# Patient Record
Sex: Male | Born: 1995 | Race: White | Hispanic: No | Marital: Single | Smoking: Former smoker
Health system: Southern US, Community
[De-identification: ages and names within clinical notes are randomized; demographics above are authoritative.]

---
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
## 2014-05-07 HISTORY — PX: ORIF ANKLE FRACTURE BIMALLEOLAR: SUR920

## 2015-02-11 ENCOUNTER — Ambulatory Visit (INDEPENDENT_AMBULATORY_CARE_PROVIDER_SITE_OTHER): Payer: BLUE CROSS/BLUE SHIELD | Admitting: Family Medicine

## 2015-02-11 ENCOUNTER — Encounter: Payer: Self-pay | Admitting: Family Medicine

## 2015-02-11 VITALS — BP 142/98 | HR 97 | Temp 98.8°F | Ht 72.08 in | Wt 270.0 lb

## 2015-02-11 DIAGNOSIS — Z130 Encounter for screening for diseases of the blood and blood-forming organs and certain disorders involving the immune mechanism: Secondary | ICD-10-CM

## 2015-02-11 DIAGNOSIS — K648 Other hemorrhoids: Secondary | ICD-10-CM | POA: Diagnosis not present

## 2015-02-11 DIAGNOSIS — K644 Residual hemorrhoidal skin tags: Secondary | ICD-10-CM

## 2015-02-11 DIAGNOSIS — E669 Obesity, unspecified: Secondary | ICD-10-CM

## 2015-02-11 DIAGNOSIS — Z0000 Encounter for general adult medical examination without abnormal findings: Secondary | ICD-10-CM | POA: Diagnosis not present

## 2015-02-11 MED ORDER — HYDROCORTISONE 2.5 % RE CREA: 1.0000 "application " | TOPICAL_CREAM | Freq: Two times a day (BID) | RECTAL | Status: AC

## 2015-02-11 NOTE — Patient Instructions (Signed)
Miralax daily (if needed). BM's should be soft and easy to pass.  You can also use an OTC stool softener if needed (Colace).  Use the topical agent to help with the irritation/pain.  You can also use Tucks pads.  Follow up annually or sooner if needed.  Take care  Dr. Adriana Simasook

## 2015-02-11 NOTE — Progress Notes (Signed)
Pre visit review using our clinic review tool, if applicable. No additional management support is needed unless otherwise documented below in the visit note. 

## 2015-02-12 DIAGNOSIS — Z0000 Encounter for general adult medical examination without abnormal findings: Secondary | ICD-10-CM | POA: Insufficient documentation

## 2015-02-12 DIAGNOSIS — E669 Obesity, unspecified: Secondary | ICD-10-CM | POA: Insufficient documentation

## 2015-02-12 DIAGNOSIS — K644 Residual hemorrhoidal skin tags: Secondary | ICD-10-CM | POA: Insufficient documentation

## 2015-02-12 NOTE — Assessment & Plan Note (Signed)
New problem. Treating with Anusol. Advised Miralax and/or Colace to soften stool and prevent straining.

## 2015-02-12 NOTE — Assessment & Plan Note (Signed)
Declined Flu. Tetanus up to date. After visit, patient's mother insisted on screening labs. Patient can return for these.

## 2015-02-12 NOTE — Progress Notes (Signed)
Subjective:  Patient ID: Frank Krause, male    DOB: 1995-06-04  Age: 20 y.o. MRN: [id number]  CC: Establish care; Rectal pain/Hemorrhoid  HPI Frank Krause is a 20 y.o. male presents to the clinic today as a new patient with the above complaint.  Preventative Healthcare  Immunizations  Tetanus - Up to date.  Flu - Declines.   Exercise: Does not exercise regularly.   Alcohol use: See below.  Smoking/tobacco use: See below.  Rectal pain/Hemorrhoid  Pain occurs with BM's.  Has been going on for the past few weeks.  He reports blood with wiping.  BM's are intermittently hard and require straining to pass.  No change in diet but eats poorly Chiropractor diet).  No associated Abd pain.  No relieving factors.  Worsened with straining.  No recent weight loss or other concerning symptoms.  PMH, Surgical Hx, Family Hx, Social History reviewed and updated as below.  History reviewed. No pertinent past medical history.   Past Surgical History  Procedure Laterality Date  . Orif ankle fracture bimalleolar Right 05/2014   Family History  Problem Relation Age of Onset  . Hyperlipidemia Mother   . Hypertension Mother   . Diabetes Mother   . Hyperlipidemia Father   . Stroke Maternal Grandmother   . Diabetes Maternal Grandmother   . Prostate cancer Maternal Grandfather   . Kidney disease Paternal Grandmother   . Lung cancer Paternal Grandfather    Social History  Substance Use Topics  . Smoking status: Former Research scientist (life sciences)  . Smokeless tobacco: Never Used  . Alcohol Use: 3.6 oz/week    6 Standard drinks or equivalent per week   Review of Systems  Gastrointestinal: Positive for rectal pain.  All other systems reviewed and are negative.  Objective:   Today's Vitals: BP 142/98 mmHg  Pulse 97  Temp(Src) 98.8 F (37.1 C) (Oral)  Ht 6' 0.08" (1.831 m)  Wt 270 lb (122.471 kg)  BMI 36.53 kg/m2  SpO2 97%  Physical Exam  Constitutional: He is oriented to  person, place, and time. He appears well-developed and well-nourished. No distress.  HENT:  Head: Normocephalic and atraumatic.  Nose: Nose normal.  Mouth/Throat: Oropharynx is clear and moist. No oropharyngeal exudate.  Normal TM's bilaterally.   Eyes: Conjunctivae are normal. No scleral icterus.  Neck: Neck supple.  Cardiovascular: Normal rate and regular rhythm.   No murmur heard. Pulmonary/Chest: Effort normal and breath sounds normal. He has no wheezes. He has no rales.  Abdominal: Soft. He exhibits no distension. There is no tenderness. There is no rebound and no guarding.  Genitourinary:  Small external hemorrhoid noted on exam.  Musculoskeletal: Normal range of motion. He exhibits no edema.  Neurological: He is alert and oriented to person, place, and time.  Skin: Skin is warm and dry. No rash noted.  Psychiatric: He has a normal mood and affect.  Vitals reviewed.  Assessment & Plan:   Problem List Items Addressed This Visit    Preventative health care    Declined Flu. Tetanus up to date. After visit, patient's mother insisted on screening labs. Patient can return for these.      Obesity (BMI 35.0-39.9 without comorbidity) (HCC)   Relevant Orders   Comp Met (CMET)   Lipid panel   HgB A1c   External hemorrhoid - Primary    New problem. Treating with Anusol. Advised Miralax and/or Colace to soften stool and prevent straining.        Other  Visit Diagnoses    Screening for deficiency anemia        Relevant Orders    CBC       Outpatient Encounter Prescriptions as of 02/11/2015  Medication Sig  . hydrocortisone (ANUSOL-HC) 2.5 % rectal cream Place 1 application rectally 2 (two) times daily.  . [DISCONTINUED] acetaminophen-codeine (TYLENOL #3) 300-30 MG per tablet Take 1-2 tablets by mouth every 6 (six) hours as needed. For pain  . [DISCONTINUED] predniSONE (STERAPRED UNI-PAK) 10 MG tablet Take 40 mg by mouth daily. He is on day 4 of pack   No  facility-administered encounter medications on file as of 02/11/2015.    Follow-up: Annually or sooner if needed.  [place]

## 2015-03-04 ENCOUNTER — Other Ambulatory Visit: Payer: BLUE CROSS/BLUE SHIELD

## 2015-03-11 ENCOUNTER — Other Ambulatory Visit: Payer: BLUE CROSS/BLUE SHIELD

## 2015-03-25 ENCOUNTER — Other Ambulatory Visit: Payer: BLUE CROSS/BLUE SHIELD

## 2018-08-18 ENCOUNTER — Other Ambulatory Visit: Payer: Self-pay | Admitting: Student

## 2018-08-18 DIAGNOSIS — R140 Abdominal distension (gaseous): Secondary | ICD-10-CM

## 2018-08-18 DIAGNOSIS — R194 Change in bowel habit: Secondary | ICD-10-CM

## 2018-08-18 DIAGNOSIS — R109 Unspecified abdominal pain: Secondary | ICD-10-CM

## 2018-09-08 ENCOUNTER — Inpatient Hospital Stay: Payer: BC Managed Care – PPO | Admitting: Anesthesiology

## 2018-09-08 ENCOUNTER — Ambulatory Visit: Payer: Self-pay | Admitting: General Surgery

## 2018-09-08 ENCOUNTER — Other Ambulatory Visit
Admission: RE | Admit: 2018-09-08 | Discharge: 2018-09-08 | Disposition: A | Discharge status: Home / Self Care | Payer: BC Managed Care – PPO | Source: Ambulatory Visit | Attending: General Surgery | Admitting: General Surgery

## 2018-09-08 ENCOUNTER — Other Ambulatory Visit: Payer: Self-pay

## 2018-09-08 ENCOUNTER — Ambulatory Visit
Admission: RE | Admit: 2018-09-08 | Discharge: 2018-09-08 | Disposition: A | Discharge status: Home / Self Care | Payer: BC Managed Care – PPO | Source: Ambulatory Visit | Attending: Student | Admitting: Student

## 2018-09-08 ENCOUNTER — Observation Stay
Admission: AD | Admit: 2018-09-08 | Discharge: 2018-09-09 | Disposition: A | Discharge status: Home / Self Care | Payer: BC Managed Care – PPO | Source: Ambulatory Visit | Attending: General Surgery | Admitting: General Surgery

## 2018-09-08 ENCOUNTER — Encounter
Admission: AD | Disposition: A | Discharge status: Home / Self Care | Payer: Self-pay | Source: Ambulatory Visit | Attending: General Surgery

## 2018-09-08 DIAGNOSIS — K3580 Unspecified acute appendicitis: Principal | ICD-10-CM | POA: Insufficient documentation

## 2018-09-08 DIAGNOSIS — K353 Acute appendicitis with localized peritonitis, without perforation or gangrene: Secondary | ICD-10-CM | POA: Diagnosis present

## 2018-09-08 DIAGNOSIS — Z20828 Contact with and (suspected) exposure to other viral communicable diseases: Secondary | ICD-10-CM | POA: Insufficient documentation

## 2018-09-08 DIAGNOSIS — K381 Appendicular concretions: Secondary | ICD-10-CM | POA: Insufficient documentation

## 2018-09-08 DIAGNOSIS — R109 Unspecified abdominal pain: Secondary | ICD-10-CM

## 2018-09-08 DIAGNOSIS — R140 Abdominal distension (gaseous): Secondary | ICD-10-CM

## 2018-09-08 DIAGNOSIS — R194 Change in bowel habit: Secondary | ICD-10-CM | POA: Insufficient documentation

## 2018-09-08 DIAGNOSIS — Z87891 Personal history of nicotine dependence: Secondary | ICD-10-CM | POA: Insufficient documentation

## 2018-09-08 HISTORY — PX: LAPAROSCOPIC APPENDECTOMY: SHX408

## 2018-09-08 LAB — SARS CORONAVIRUS 2 BY RT PCR (HOSPITAL ORDER, PERFORMED IN ~~LOC~~ HOSPITAL LAB): SARS Coronavirus 2: NEGATIVE

## 2018-09-08 LAB — CREATININE, SERUM
Creatinine, Ser: 0.83 mg/dL (ref 0.61–1.24)
GFR calc Af Amer: >60 mL/min (ref >60)
GFR calc non Af Amer: >60 mL/min (ref >60)

## 2018-09-08 LAB — MRSA PCR SCREENING: MRSA by PCR: NEGATIVE

## 2018-09-08 SURGERY — APPENDECTOMY, LAPAROSCOPIC
Anesthesia: General

## 2018-09-08 MED ORDER — KETOROLAC TROMETHAMINE 30 MG/ML IJ SOLN
INTRAMUSCULAR | Status: AC
  Filled 2018-09-08: qty 1

## 2018-09-08 MED ORDER — FENTANYL CITRATE (PF) 100 MCG/2ML IJ SOLN
INTRAMUSCULAR | Status: DC | PRN
  Administered 2018-09-08 (×4): 50 ug via INTRAVENOUS

## 2018-09-08 MED ORDER — SUGAMMADEX SODIUM 200 MG/2ML IV SOLN
INTRAVENOUS | Status: AC
  Filled 2018-09-08: qty 2

## 2018-09-08 MED ORDER — FENTANYL CITRATE (PF) 100 MCG/2ML IJ SOLN
INTRAMUSCULAR | Status: AC
  Filled 2018-09-08: qty 2

## 2018-09-08 MED ORDER — PROPOFOL 10 MG/ML IV BOLUS
INTRAVENOUS | Status: AC
  Filled 2018-09-08: qty 20

## 2018-09-08 MED ORDER — DEXAMETHASONE SODIUM PHOSPHATE 10 MG/ML IJ SOLN
INTRAMUSCULAR | Status: DC | PRN
  Administered 2018-09-08: 10 mg via INTRAVENOUS

## 2018-09-08 MED ORDER — SODIUM CHLORIDE 0.9 % IV SOLN
INTRAVENOUS | Status: DC
  Administered 2018-09-08: 17:00:00 via INTRAVENOUS

## 2018-09-08 MED ORDER — FENTANYL CITRATE (PF) 100 MCG/2ML IJ SOLN
25.0000 ug | INTRAMUSCULAR | Status: DC | PRN
  Administered 2018-09-08 (×4): 25 ug via INTRAVENOUS

## 2018-09-08 MED ORDER — ENOXAPARIN SODIUM 40 MG/0.4ML ~~LOC~~ SOLN: 40.0000 mg | SUBCUTANEOUS | Status: DC

## 2018-09-08 MED ORDER — DEXAMETHASONE SODIUM PHOSPHATE 10 MG/ML IJ SOLN
INTRAMUSCULAR | Status: AC
  Filled 2018-09-08: qty 1

## 2018-09-08 MED ORDER — BUPIVACAINE-EPINEPHRINE 0.5% -1:200000 IJ SOLN
INTRAMUSCULAR | Status: DC | PRN
  Administered 2018-09-08: 28 mL

## 2018-09-08 MED ORDER — MIDAZOLAM HCL 2 MG/2ML IJ SOLN
INTRAMUSCULAR | Status: AC
  Filled 2018-09-08: qty 2

## 2018-09-08 MED ORDER — ACETAMINOPHEN 325 MG PO TABS: 650.0000 mg | ORAL_TABLET | Freq: Four times a day (QID) | ORAL | Status: DC | PRN

## 2018-09-08 MED ORDER — ONDANSETRON HCL 4 MG/2ML IJ SOLN: 4.0000 mg | Freq: Four times a day (QID) | INTRAMUSCULAR | Status: DC | PRN

## 2018-09-08 MED ORDER — MORPHINE SULFATE (PF) 4 MG/ML IV SOLN
4.0000 mg | INTRAVENOUS | Status: DC | PRN
  Administered 2018-09-09: 4 mg via INTRAVENOUS
  Filled 2018-09-08: qty 1

## 2018-09-08 MED ORDER — IOHEXOL 300 MG/ML  SOLN
100.0000 mL | Freq: Once | INTRAMUSCULAR | Status: AC | PRN
  Administered 2018-09-08: 11:00:00 100 mL via INTRAVENOUS

## 2018-09-08 MED ORDER — ACETAMINOPHEN 650 MG RE SUPP: 650.0000 mg | Freq: Four times a day (QID) | RECTAL | Status: DC | PRN

## 2018-09-08 MED ORDER — KETOROLAC TROMETHAMINE 30 MG/ML IJ SOLN
INTRAMUSCULAR | Status: DC | PRN
  Administered 2018-09-08: 30 mg via INTRAVENOUS

## 2018-09-08 MED ORDER — LACTATED RINGERS IV SOLN
INTRAVENOUS | Status: DC | PRN
  Administered 2018-09-08 (×2): via INTRAVENOUS

## 2018-09-08 MED ORDER — PIPERACILLIN-TAZOBACTAM 3.375 G IVPB
3.3750 g | Freq: Three times a day (TID) | INTRAVENOUS | Status: DC
  Administered 2018-09-08 – 2018-09-09 (×3): 3.375 g via INTRAVENOUS
  Filled 2018-09-08 (×3): qty 50

## 2018-09-08 MED ORDER — ROCURONIUM BROMIDE 100 MG/10ML IV SOLN
INTRAVENOUS | Status: DC | PRN
  Administered 2018-09-08: 10 mg via INTRAVENOUS
  Administered 2018-09-08: 20 mg via INTRAVENOUS
  Administered 2018-09-08: 40 mg via INTRAVENOUS

## 2018-09-08 MED ORDER — ONDANSETRON 4 MG PO TBDP: 4.0000 mg | ORAL_TABLET | Freq: Four times a day (QID) | ORAL | Status: DC | PRN

## 2018-09-08 MED ORDER — ONDANSETRON HCL 4 MG/2ML IJ SOLN: 4.0000 mg | Freq: Once | INTRAMUSCULAR | Status: DC | PRN

## 2018-09-08 MED ORDER — BUPIVACAINE-EPINEPHRINE (PF) 0.5% -1:200000 IJ SOLN
INTRAMUSCULAR | Status: AC
  Filled 2018-09-08: qty 30

## 2018-09-08 MED ORDER — PROPOFOL 10 MG/ML IV BOLUS
INTRAVENOUS | Status: DC | PRN
  Administered 2018-09-08: 200 mg via INTRAVENOUS

## 2018-09-08 MED ORDER — DEXMEDETOMIDINE HCL 200 MCG/2ML IV SOLN
INTRAVENOUS | Status: DC | PRN
  Administered 2018-09-08 (×6): 2 ug via INTRAVENOUS

## 2018-09-08 MED ORDER — ONDANSETRON HCL 4 MG/2ML IJ SOLN
INTRAMUSCULAR | Status: DC | PRN
  Administered 2018-09-08: 4 mg via INTRAVENOUS

## 2018-09-08 MED ORDER — HYDROCODONE-ACETAMINOPHEN 5-325 MG PO TABS
1.0000 | ORAL_TABLET | ORAL | Status: DC | PRN
  Administered 2018-09-09: 2 via ORAL
  Filled 2018-09-08: qty 2

## 2018-09-08 MED ORDER — FENTANYL CITRATE (PF) 100 MCG/2ML IJ SOLN
INTRAMUSCULAR | Status: AC
  Administered 2018-09-08: 25 ug via INTRAVENOUS
  Filled 2018-09-08: qty 2

## 2018-09-08 MED ORDER — ONDANSETRON HCL 4 MG/2ML IJ SOLN
INTRAMUSCULAR | Status: AC
  Filled 2018-09-08: qty 2

## 2018-09-08 MED ORDER — MIDAZOLAM HCL 2 MG/2ML IJ SOLN
INTRAMUSCULAR | Status: DC | PRN
  Administered 2018-09-08: 2 mg via INTRAVENOUS

## 2018-09-08 SURGICAL SUPPLY — 42 items
APPLIER CLIP LOGIC TI 5 (MISCELLANEOUS) IMPLANT
BLADE SURG SZ11 CARB STEEL (BLADE) ×3 IMPLANT
CANISTER SUCT 1200ML W/VALVE (MISCELLANEOUS) ×3 IMPLANT
CHLORAPREP W/TINT 26 (MISCELLANEOUS) ×3 IMPLANT
COVER WAND RF STERILE (DRAPES) ×1 IMPLANT
CUTTER FLEX LINEAR 45M (STAPLE) ×3 IMPLANT
DERMABOND ADVANCED (GAUZE/BANDAGES/DRESSINGS) ×2
DERMABOND ADVANCED .7 DNX12 (GAUZE/BANDAGES/DRESSINGS) ×1 IMPLANT
ELECT REM PT RETURN 9FT ADLT (ELECTROSURGICAL) ×3
ELECTRODE REM PT RTRN 9FT ADLT (ELECTROSURGICAL) ×1 IMPLANT
GLOVE BIO SURGEON STRL SZ 6.5 (GLOVE) ×4 IMPLANT
GLOVE BIO SURGEONS STRL SZ 6.5 (GLOVE) ×3
GLOVE INDICATOR 6.5 STRL GRN (GLOVE) ×7 IMPLANT
GOWN STRL REUS W/ TWL LRG LVL3 (GOWN DISPOSABLE) ×3 IMPLANT
GOWN STRL REUS W/TWL LRG LVL3 (GOWN DISPOSABLE) ×6
GRASPER SUT TROCAR 14GX15 (MISCELLANEOUS) ×3 IMPLANT
HANDLE YANKAUER SUCT BULB TIP (MISCELLANEOUS) ×3 IMPLANT
IRRIGATION STRYKERFLOW (MISCELLANEOUS) IMPLANT
IRRIGATOR STRYKERFLOW (MISCELLANEOUS) ×3
IV NS 1000ML (IV SOLUTION) ×2
IV NS 1000ML BAXH (IV SOLUTION) ×1 IMPLANT
KIT TURNOVER KIT A (KITS) ×3 IMPLANT
LIGASURE LAP MARYLAND 5MM 37CM (ELECTROSURGICAL) ×3 IMPLANT
NEEDLE HYPO 22GX1.5 SAFETY (NEEDLE) ×3 IMPLANT
NEEDLE VERESS 14GA 120MM (NEEDLE) ×3 IMPLANT
NS IRRIG 500ML POUR BTL (IV SOLUTION) ×3 IMPLANT
PACK LAP CHOLECYSTECTOMY (MISCELLANEOUS) ×3 IMPLANT
POUCH ENDO CATCH 10MM SPEC (MISCELLANEOUS) ×3 IMPLANT
RELOAD 45 VASCULAR/THIN (ENDOMECHANICALS) IMPLANT
RELOAD STAPLE 45 2.5 WHT GRN (ENDOMECHANICALS) IMPLANT
RELOAD STAPLE 45 3.5 BLU ETS (ENDOMECHANICALS) ×1 IMPLANT
RELOAD STAPLE TA45 3.5 REG BLU (ENDOMECHANICALS) ×3 IMPLANT
SCISSORS METZENBAUM CVD 33 (INSTRUMENTS) ×1 IMPLANT
SET TUBE SMOKE EVAC HIGH FLOW (TUBING) ×3 IMPLANT
SLEEVE ENDOPATH XCEL 5M (ENDOMECHANICALS) ×3 IMPLANT
SPONGE GAUZE 2X2 8PLY STER LF (GAUZE/BANDAGES/DRESSINGS)
SPONGE GAUZE 2X2 8PLY STRL LF (GAUZE/BANDAGES/DRESSINGS) ×1 IMPLANT
SUT MNCRL AB 4-0 PS2 18 (SUTURE) ×3 IMPLANT
SUT VICRYL PLUS ABS 0 54 (SUTURE) ×3 IMPLANT
TRAY FOLEY MTR SLVR 16FR STAT (SET/KITS/TRAYS/PACK) ×3 IMPLANT
TROCAR XCEL 12X100 BLDLESS (ENDOMECHANICALS) ×3 IMPLANT
TROCAR XCEL NON-BLD 5MMX100MML (ENDOMECHANICALS) ×3 IMPLANT

## 2018-09-08 NOTE — Anesthesia Post-op Follow-up Note (Signed)
Anesthesia QCDR form completed.        

## 2018-09-08 NOTE — Anesthesia Preprocedure Evaluation (Signed)
Anesthesia Evaluation  Patient identified by MRN, date of birth, ID band Patient awake    Reviewed: Allergy & Precautions, NPO status , Patient's Chart, lab work & pertinent test results  History of Anesthesia Complications Negative for: history of anesthetic complications  Airway Mallampati: II       Dental   Pulmonary neg sleep apnea, neg COPD, former smoker,           Cardiovascular (-) hypertension(-) Past MI and (-) CHF (-) dysrhythmias (-) Valvular Problems/Murmurs     Neuro/Psych neg Seizures    GI/Hepatic Neg liver ROS, neg GERD  ,  Endo/Other  neg diabetes  Renal/GU negative Renal ROS     Musculoskeletal   Abdominal   Peds  Hematology   Anesthesia Other Findings   Reproductive/Obstetrics                             Anesthesia Physical Anesthesia Plan  ASA: I and emergent  Anesthesia Plan: General   Post-op Pain Management:    Induction: Intravenous  PONV Risk Score and Plan: 2 and Dexamethasone and Ondansetron  Airway Management Planned: Oral ETT  Additional Equipment:   Intra-op Plan:   Post-operative Plan:   Informed Consent: I have reviewed the patients History and Physical, chart, labs and discussed the procedure including the risks, benefits and alternatives for the proposed anesthesia with the patient or authorized representative who has indicated his/her understanding and acceptance.       Plan Discussed with:   Anesthesia Plan Comments:         Anesthesia Quick Evaluation

## 2018-09-08 NOTE — Anesthesia Postprocedure Evaluation (Signed)
Anesthesia Post Note  Patient: Frank Krause  Procedure(s) Performed: APPENDECTOMY LAPAROSCOPIC (N/A )  Patient location during evaluation: PACU Anesthesia Type: General Level of consciousness: awake and alert Pain management: pain level controlled Vital Signs Assessment: post-procedure vital signs reviewed and stable Respiratory status: spontaneous breathing and respiratory function stable Cardiovascular status: stable Anesthetic complications: no     Last Vitals:  Vitals:   09/08/18 1641 09/08/18 2214  BP: (!) 164/82 126/64  Pulse: (!) 56 85  Resp: 16 20  Temp: 36.8 C 36.6 C  SpO2: 100% 100%    Last Pain:  Vitals:   09/08/18 2214  TempSrc:   PainSc: Asleep                 Aariya Ferrick K

## 2018-09-08 NOTE — Anesthesia Procedure Notes (Signed)
Procedure Name: Intubation Date/Time: 09/08/2018 8:33 PM Performed by: Lendon Colonel, CRNA Pre-anesthesia Checklist: Patient identified, Patient being monitored, Timeout performed, Emergency Drugs available and Suction available Patient Re-evaluated:Patient Re-evaluated prior to induction Oxygen Delivery Method: Circle system utilized Preoxygenation: Pre-oxygenation with 100% oxygen Induction Type: IV induction Ventilation: Mask ventilation without difficulty Laryngoscope Size: Miller and 2 Grade View: Grade I Tube type: Oral Tube size: 7.5 mm Number of attempts: 1 Airway Equipment and Method: Stylet Placement Confirmation: ETT inserted through vocal cords under direct vision,  positive ETCO2 and breath sounds checked- equal and bilateral Secured at: 21 cm Tube secured with: Tape Dental Injury: Teeth and Oropharynx as per pre-operative assessment

## 2018-09-08 NOTE — H&P (Signed)
SURGICAL CONSULTATION NOTE   HISTORY OF PRESENT ILLNESS (HPI):  23 y.o. male presented to [facility] ED for evaluation of abdominal pain. Patient reports started with abdominal pain since 2 days ago. Pain on right lower quadrant. Pain does not radiates to other part of the body. There has been no alleviating or aggravating factors. Denies vomiting. Denies fever or chills.  Surgery is consulted by Michelle Johnson, PA in this context for evaluation and management of acute appendicitis.  PAST MEDICAL HISTORY (PMH):  No pertinent past medical history. History reviewed.   PAST SURGICAL HISTORY (PSH):  Past Surgical History:  Procedure Laterality Date  . ORIF ANKLE FRACTURE BIMALLEOLAR Right 05/2014     MEDICATIONS:  Prior to Admission medications   Medication Sig Start Date End Date Taking? Authorizing Provider  hydrocortisone (ANUSOL-HC) 2.5 % rectal cream Place 1 application rectally 2 (two) times daily. 02/11/15   Cook, Jayce G, DO     ALLERGIES:  No Known Allergies   SOCIAL HISTORY:  Social History   Socioeconomic History  . Marital status: Single    Spouse name: Not on file  . Number of children: Not on file  . Years of education: Not on file  . Highest education level: Not on file  Occupational History  . Not on file  Social Needs  . Financial resource strain: Not on file  . Food insecurity    Worry: Not on file    Inability: Not on file  . Transportation needs    Medical: Not on file    Non-medical: Not on file  Tobacco Use  . Smoking status: Former Smoker  . Smokeless tobacco: Never Used  Substance and Sexual Activity  . Alcohol use: Yes    Alcohol/week: 6.0 standard drinks    Types: 6 Standard drinks or equivalent per week  . Drug use: No  . Sexual activity: Yes    Partners: Female  Lifestyle  . Physical activity    Days per week: Not on file    Minutes per session: Not on file  . Stress: Not on file  Relationships  . Social connections    Talks on phone:  Not on file    Gets together: Not on file    Attends religious service: Not on file    Active member of club or organization: Not on file    Attends meetings of clubs or organizations: Not on file    Relationship status: Not on file  . Intimate partner violence    Fear of current or ex partner: Not on file    Emotionally abused: Not on file    Physically abused: Not on file    Forced sexual activity: Not on file  Other Topics Concern  . Not on file  Social History Narrative  . Not on file    The patient currently resides (home / rehab facility / nursing home): Home The patient normally is (ambulatory / bedbound): Ambulatory   FAMILY HISTORY:  Family History  Problem Relation Age of Onset  . Hyperlipidemia Mother   . Hypertension Mother   . Diabetes Mother   . Hyperlipidemia Father   . Stroke Maternal Grandmother   . Diabetes Maternal Grandmother   . Prostate cancer Maternal Grandfather   . Kidney disease Paternal Grandmother   . Lung cancer Paternal Grandfather      REVIEW OF SYSTEMS:  Constitutional: denies weight loss, fever, chills, or sweats  Eyes: denies any other vision changes, history of eye injury    ENT: denies sore throat, hearing problems  Respiratory: denies shortness of breath, wheezing  Cardiovascular: denies chest pain, palpitations  Gastrointestinal: positive abdominal pain, denies nausea and vomiting Genitourinary: denies burning with urination or urinary frequency Musculoskeletal: denies any other joint pains or cramps  Skin: denies any other rashes or skin discolorations  Neurological: denies any other headache, dizziness, weakness  Psychiatric: denies any other depression, anxiety   All other review of systems were negative   VITAL SIGNS:  BP: 145/89 Pulse: 73 Temp: 36.7 C  Weight: 98.2 kg BMI: 29.35   PHYSICAL EXAM:  Constitutional:  -- Normal body habitus  -- Awake, alert, and oriented x3  Eyes:  -- Pupils equally round and reactive  to light  -- No scleral icterus  Ear, nose, and throat:  -- No jugular venous distension  Pulmonary:  -- No crackles  -- Equal breath sounds bilaterally -- Breathing non-labored at rest Cardiovascular:  -- S1, S2 present  -- No pericardial rubs Gastrointestinal:  -- Abdomen soft, tender in right lower quadrant, non-distended, no guarding or rebound tenderness -- No abdominal masses appreciated, pulsatile or otherwise  Musculoskeletal and Integumentary:  -- Wounds or skin discoloration: None appreciated -- Extremities: B/L UE and LE FROM, hands and feet warm, no edema  Neurologic:  -- Motor function: intact and symmetric -- Sensation: intact and symmetric   Labs:  No flowsheet data found. CMP Latest Ref Rng & Units 04/24/2011  Glucose 70 - 99 mg/dL 124(H)  BUN 6 - 23 mg/dL 9  Creatinine 0.47 - 1.00 mg/dL 0.82  Sodium 135 - 145 mEq/L 134(L)  Potassium 3.5 - 5.1 mEq/L 4.0  Chloride 96 - 112 mEq/L 97  CO2 19 - 32 mEq/L 23  Calcium 8.4 - 10.5 mg/dL 9.4   CBC from 08/04/2018 shows WBC of 8.2, Hemoglobin of 14.9 and Platelets of 375.   Imaging studies: I personally evaluated the images.   EXAM: CT ABDOMEN AND PELVIS WITH CONTRAST  TECHNIQUE: Multidetector CT imaging of the abdomen and pelvis was performed using the standard protocol following bolus administration of intravenous contrast. Oral contrast was also administered w.  CONTRAST:  100mL OMNIPAQUE IOHEXOL 300 MG/ML  SOLN  COMPARISON:  None.  FINDINGS: Lower chest: Lung bases are clear.  Hepatobiliary: There is fatty infiltration near the fissure for the ligamentum teres. No focal liver lesions are evident. Gallbladder wall is not appreciably thickened. There is no biliary duct dilatation.  Pancreas: There is no pancreatic mass or inflammatory focus.  Spleen: Spleen measures 14.1 x 11.7 x 6.4 cm with a measured splenic volume of 528 cubic cm. No focal splenic lesions are evident.  Adrenals/Urinary  Tract: Adrenals bilaterally appear normal. Kidneys bilaterally show no evident mass or hydronephrosis on either side. There is no renal or ureteral calculus on either side. Urinary bladder is midline with wall thickness within normal limits.  Stomach/Bowel: There is no evident bowel wall or mesenteric thickening. No bowel obstruction. Terminal ileum appears unremarkable. There is no free air or portal venous air.  Vascular/Lymphatic: No abdominal aortic aneurysm. No vascular lesions are evident. There is no adenopathy in the abdomen or pelvis.  Reproductive: The prostate and seminal vesicles are normal in size and contour. No evident pelvic mass.  Other: The appendix is diffusely thickened, measuring up to 23 mm in thickness. There is an appendicolith in the proximal appendix measuring 9 x 6 mm. There is soft tissue stranding along the course of the appendix. No abscess or perforation evident in   this area.  No abscess or ascites evident in the abdomen or pelvis.  Musculoskeletal: There are no blastic or lytic bone lesions. There is no intramuscular or abdominal wall lesions.  IMPRESSION: 1.  Findings indicative of acute appendicitis.  Appendix: Location: Appendix arises medially from the cecum in the upper pelvic region on the right.  Diameter: Up to 23 mm proximally.  Appendix tapers more distally.  Appendicolith: 9 x 6 mm appendicolith.  Mucosal hyper-enhancement: None  Extraluminal gas: None  Periappendiceal collection: Slight. There is stranding in the soft tissues adjacent to the appendix. No appendiceal abscess evident.  2.  Splenomegaly of uncertain etiology.  No splenic lesions evident.  3.  No bowel obstruction.  No abscess in the abdomen or pelvis.  4. No renal or ureteral calculus. No hydronephrosis. Urinary bladder wall thickness normal.  Critical Value/emergent results were called by telephone at the time of interpretation on 09/08/2018  at 11:38 am to AMBER BROWN, RN who verbally acknowledged these results. Ms. Brown promised to make Michelle Johnson, PA, aware of this report as soon as she was available.   Electronically Signed   By: William  Woodruff III M.D.   On: 09/08/2018 11:39   Assessment/Plan:  23 y.o. male with acute appendicitis.  Patient with history, physical exam and images consistent with acute appendicitis. Patient oriented about diagnosis and surgical management as treatment. Patient oriented about goals of surgery and its risk including: bowel injury, infection, abscess, bleeding, leak from cecum, intestinal adhesions, bowel obstruction, fistula, injury to the ureter among others.  Patient understood and agreed to proceed with surgery. Will admit patient, already started on antibiotic therapy, will give IV hydration since patient is NPO and schedule to OR.   Yadir Zentner Cintrn-Daz, MD  

## 2018-09-08 NOTE — H&P (Signed)
SURGICAL CONSULTATION NOTE   HISTORY OF PRESENT ILLNESS (HPI):  23 y.o. male [facility] for evaluation of abdominal pain. Patient reports started with abdominal pain since 2 days ago. Pain on right lower quadrant. Pain does not radiates to other part of the body. There has been no alleviating or aggravating factors. Denies vomiting. Denies fever or chills.  Surgery is consulted by Harmon DunMichelle Johnson, PA in this context for evaluation and management of acute appendicitis.  PAST MEDICAL HISTORY (PMH):  No pertinent past medical history. History reviewed.   PAST SURGICAL HISTORY (PSH):  Past Surgical History:  Procedure Laterality Date  . ORIF ANKLE FRACTURE BIMALLEOLAR Right 05/2014     MEDICATIONS:  Prior to Admission medications   Medication Sig Start Date End Date Taking? Authorizing Provider  hydrocortisone (ANUSOL-HC) 2.5 % rectal cream Place 1 application rectally 2 (two) times daily. 02/11/15   Tommie Samsook, Jayce G, DO     ALLERGIES:  No Known Allergies   SOCIAL HISTORY:  Social History   Socioeconomic History  . Marital status: Single    Spouse name: Not on file  . Number of children: Not on file  . Years of education: Not on file  . Highest education level: Not on file  Occupational History  . Not on file  Social Needs  . Financial resource strain: Not on file  . Food insecurity    Worry: Not on file    Inability: Not on file  . Transportation needs    Medical: Not on file    Non-medical: Not on file  Tobacco Use  . Smoking status: Former Games developermoker  . Smokeless tobacco: Never Used  Substance and Sexual Activity  . Alcohol use: Yes    Alcohol/week: 6.0 standard drinks    Types: 6 Standard drinks or equivalent per week  . Drug use: No  . Sexual activity: Yes    Partners: Female  Lifestyle  . Physical activity    Days per week: Not on file    Minutes per session: Not on file  . Stress: Not on file  Relationships  . Social Musicianconnections    Talks on phone:  Not on file    Gets together: Not on file    Attends religious service: Not on file    Active member of club or organization: Not on file    Attends meetings of clubs or organizations: Not on file    Relationship status: Not on file  . Intimate partner violence    Fear of current or ex partner: Not on file    Emotionally abused: Not on file    Physically abused: Not on file    Forced sexual activity: Not on file  Other Topics Concern  . Not on file  Social History Narrative  . Not on file    The patient currently resides (home / rehab facility / nursing home): Home The patient normally is (ambulatory / bedbound): Ambulatory   FAMILY HISTORY:  Family History  Problem Relation Age of Onset  . Hyperlipidemia Mother   . Hypertension Mother   . Diabetes Mother   . Hyperlipidemia Father   . Stroke Maternal Grandmother   . Diabetes Maternal Grandmother   . Prostate cancer Maternal Grandfather   . Kidney disease Paternal Grandmother   . Lung cancer Paternal Grandfather      REVIEW OF SYSTEMS:  Constitutional: denies weight loss, fever, chills, or sweats  Eyes: denies any other vision changes, history of eye injury  ENT: denies sore throat, hearing problems  Respiratory: denies shortness of breath, wheezing  Cardiovascular: denies chest pain, palpitations  Gastrointestinal: positive abdominal pain, denies nausea and vomiting Genitourinary: denies burning with urination or urinary frequency Musculoskeletal: denies any other joint pains or cramps  Skin: denies any other rashes or skin discolorations  Neurological: denies any other headache, dizziness, weakness  Psychiatric: denies any other depression, anxiety   All other review of systems were negative   VITAL SIGNS:  BP: 145/89 Pulse: 73 Temp: 36.7 C  Weight: 98.2 kg BMI: 29.35   PHYSICAL EXAM:  Constitutional:  -- Normal body habitus  -- Awake, alert, and oriented x3  Eyes:  -- Pupils equally round and reactive  to light  -- No scleral icterus  Ear, nose, and throat:  -- No jugular venous distension  Pulmonary:  -- No crackles  -- Equal breath sounds bilaterally -- Breathing non-labored at rest Cardiovascular:  -- S1, S2 present  -- No pericardial rubs Gastrointestinal:  -- Abdomen soft, tender in right lower quadrant, non-distended, no guarding or rebound tenderness -- No abdominal masses appreciated, pulsatile or otherwise  Musculoskeletal and Integumentary:  -- Wounds or skin discoloration: None appreciated -- Extremities: B/L UE and LE FROM, hands and feet warm, no edema  Neurologic:  -- Motor function: intact and symmetric -- Sensation: intact and symmetric   Labs:  No flowsheet data found. CMP Latest Ref Rng & Units 04/24/2011  Glucose 70 - 99 mg/dL 124(H)  BUN 6 - 23 mg/dL 9  Creatinine 0.47 - 1.00 mg/dL 0.82  Sodium 135 - 145 mEq/L 134(L)  Potassium 3.5 - 5.1 mEq/L 4.0  Chloride 96 - 112 mEq/L 97  CO2 19 - 32 mEq/L 23  Calcium 8.4 - 10.5 mg/dL 9.4   CBC from 08/04/2018 shows WBC of 8.2, Hemoglobin of 14.9 and Platelets of 375.   Imaging studies: I personally evaluated the images.   EXAM: CT ABDOMEN AND PELVIS WITH CONTRAST  TECHNIQUE: Multidetector CT imaging of the abdomen and pelvis was performed using the standard protocol following bolus administration of intravenous contrast. Oral contrast was also administered w.  CONTRAST:  133mL OMNIPAQUE IOHEXOL 300 MG/ML  SOLN  COMPARISON:  None.  FINDINGS: Lower chest: Lung bases are clear.  Hepatobiliary: There is fatty infiltration near the fissure for the ligamentum teres. No focal liver lesions are evident. Gallbladder wall is not appreciably thickened. There is no biliary duct dilatation.  Pancreas: There is no pancreatic mass or inflammatory focus.  Spleen: Spleen measures 14.1 x 11.7 x 6.4 cm with a measured splenic volume of 528 cubic cm. No focal splenic lesions are evident.  Adrenals/Urinary  Tract: Adrenals bilaterally appear normal. Kidneys bilaterally show no evident mass or hydronephrosis on either side. There is no renal or ureteral calculus on either side. Urinary bladder is midline with wall thickness within normal limits.  Stomach/Bowel: There is no evident bowel wall or mesenteric thickening. No bowel obstruction. Terminal ileum appears unremarkable. There is no free air or portal venous air.  Vascular/Lymphatic: No abdominal aortic aneurysm. No vascular lesions are evident. There is no adenopathy in the abdomen or pelvis.  Reproductive: The prostate and seminal vesicles are normal in size and contour. No evident pelvic mass.  Other: The appendix is diffusely thickened, measuring up to 23 mm in thickness. There is an appendicolith in the proximal appendix measuring 9 x 6 mm. There is soft tissue stranding along the course of the appendix. No abscess or perforation evident in  this area.  No abscess or ascites evident in the abdomen or pelvis.  Musculoskeletal: There are no blastic or lytic bone lesions. There is no intramuscular or abdominal wall lesions.  IMPRESSION: 1.  Findings indicative of acute appendicitis.  Appendix: Location: Appendix arises medially from the cecum in the upper pelvic region on the right.  Diameter: Up to 23 mm proximally.  Appendix tapers more distally.  Appendicolith: 9 x 6 mm appendicolith.  Mucosal hyper-enhancement: None  Extraluminal gas: None  Periappendiceal collection: Slight. There is stranding in the soft tissues adjacent to the appendix. No appendiceal abscess evident.  2.  Splenomegaly of uncertain etiology.  No splenic lesions evident.  3.  No bowel obstruction.  No abscess in the abdomen or pelvis.  4. No renal or ureteral calculus. No hydronephrosis. Urinary bladder wall thickness normal.  Critical Value/emergent results were called by telephone at the time of interpretation on 09/08/2018  at 11:38 am to [facility] Memorial[facility]ER BROWN, RN who verbally acknowledged these results. Ms. Manson PasseyBrown promised to make Harmon DunMichelle Johnson, [place], aware of this report as soon as she was available.   Electronically Signed   By: Bretta BangWilliam  Woodruff III M.D.   On: 09/08/2018 11:39   Assessment/Plan:  23 y.o. male with acute appendicitis.  Patient with history, physical exam and images consistent with acute appendicitis. Patient oriented about diagnosis and surgical management as treatment. Patient oriented about goals of surgery and its risk including: bowel injury, infection, abscess, bleeding, leak from cecum, intestinal adhesions, bowel obstruction, fistula, injury to the ureter among others.  Patient understood and agreed to proceed with surgery. Will admit patient, already started on antibiotic therapy, will give IV hydration since patient is NPO and schedule to OR.   Gae GallopEdgardo Cintrn-Daz, MD

## 2018-09-08 NOTE — Transfer of Care (Signed)
Immediate Anesthesia Transfer of Care Note  Patient: Frank Krause  Procedure(s) Performed: APPENDECTOMY LAPAROSCOPIC (N/A )  Patient Location: PACU  Anesthesia Type:General  Level of Consciousness: oriented and patient cooperative  Airway & Oxygen Therapy: Patient Spontanous Breathing and Patient connected to face mask oxygen  Post-op Assessment: Report given to RN and Post -op Vital signs reviewed and stable  Post vital signs: Reviewed and stable  Last Vitals:  Vitals Value Taken Time  BP 126/64 09/08/18 2214  Temp 36.6 C 09/08/18 2214  Pulse 81 09/08/18 2216  Resp 22 09/08/18 2216  SpO2 100 % 09/08/18 2216  Vitals shown include unvalidated device data.  Last Pain:  Vitals:   09/08/18 1700  TempSrc:   PainSc: 0-No pain         Complications: No apparent anesthesia complications

## 2018-09-08 NOTE — Op Note (Signed)
Preoperative diagnosis: Acute appendicitis.  Postoperative diagnosis: Acute appendicitis  Procedure: Laparoscopic appendectomy.  Anesthesia: GETA  Surgeon: Dr. Windell Moment, MD  Wound Classification: Contaminated  Indications: Patient is a 23 y.o. male  presented with right lower quadrant pain of 2 days of duration. Computed tomography scan and physical examination were consistent with acute appendicitis.   Findings: 1. Acutely inflamed appendix 2. No peri-appendiceal abscess or phlegmon 3. Normal anatomy 4. Adequate hemostasis.   Description of procedure: The patient was placed on the operating table in the supine position. General anesthesia was induced. A time-out was completed verifying correct patient, procedure, site, positioning, and implant(s) and/or special equipment prior to beginning this procedure. A Foley catheter and orogastric tubes were placed. The abdomen was prepped and draped in the usual sterile fashion.  An incision was made in a natural skin line above the umbilicus.   The fascia was elevated and the Veress needle inserted. Proper position was confirmed by aspiration and saline meniscus test. The abdomen was insufflated with carbon dioxide to a pressure of 15 mmHg. The patient tolerated insufflation well. A 5-mm optiview trocar was then inserted supraumbilically. The laparoscope was inserted and the abdomen inspected. No injuries from initial trocar placement were noted. Turbid fluid was noted in the right lower quadrant. Under direct visualization, an 12-mm trocar was inserted in the left lower quadrant lateral to the rectus muscle. A 5-mm port was then placed above the symphysis pubis on midline.  Care was taken to avoid injury to the bladder or inferior epigastric vessels. The table was placed in the Trendelenburg position with the right side elevated.  The cecum was gently grasped with an endoscopic graspers and pulled toward (the left upper quadrant). An atraumatic  grasper was then passed through the suprapubic port and omentum was dissected away until the appendix was identified. The appendix was then grasped and elevated. It was noted to be inflamed.  An endoscopic linear cutting stapler was then used to divide and staple the base of the appendix. LigaSure used to divide the mesoappendix.  The appendix was placed in an endoscopic retrieval bag and removed.  The appendiceal stump was then irrigated and hemostasis was assured. Fluid was suctioned and no other pathology was identified.  Secondary trocars were removed under direct vision. No bleeding was noted. The laparoscope was withdrawn and the umbilical trocar removed. The abdomen was allowed to collapse. All trocar sites greater than 5 mm were closed with Vicryl 0. The skin was closed with subcuticular sutures Monocryl 3-0 of and steristrips.  The patient tolerated the procedure well and was taken to the postanesthesia care unit in satisfactory condition.   Specimen: Appendix  Complications: None  Estimated Blood Loss: 5 mL

## 2018-09-09 ENCOUNTER — Encounter: Payer: Self-pay | Admitting: General Surgery

## 2018-09-09 DIAGNOSIS — K3580 Unspecified acute appendicitis: Secondary | ICD-10-CM | POA: Diagnosis not present

## 2018-09-09 MED ORDER — HYDROCODONE-ACETAMINOPHEN 5-325 MG PO TABS: 1.0000 | ORAL_TABLET | ORAL | 0 refills | Status: AC | PRN

## 2018-09-09 MED ORDER — CEFAZOLIN SODIUM-DEXTROSE 2-4 GM/100ML-% IV SOLN
2.0000 g | INTRAVENOUS | Status: DC
  Filled 2018-09-09: qty 100

## 2018-09-09 NOTE — Discharge Instructions (Signed)

## 2018-09-09 NOTE — Discharge Summary (Signed)
  Patient ID: Frank Krause MRN: [id number] DOB/AGE: [birth year] 25 y.o.  Admit date: 09/08/2018 Discharge date: 09/09/2018   Discharge Diagnoses:  Active Problems:   Acute appendicitis with localized peritonitis   Procedures: Laparoscopic appendectomy  Hospital Course: Patient with acute appendicitis. He was admitted for laparoscopic appendectomy. Underwent laparoscopic appendectomy. Tolerated procedure well. Today tolerated breakfast, ambulated and pain controlled. Wounds healing well.   Physical Exam  Constitutional: He is oriented to person, place, and time and well-developed, well-nourished, and in no distress.  Cardiovascular: Normal rate and regular rhythm.  Pulmonary/Chest: Effort normal.  Abdominal: Soft.  Neurological: He is alert and oriented to person, place, and time.  Skin: Skin is warm.    Consults: None  Disposition: Discharge disposition: 01-Home or Self Care       Discharge Instructions    Diet - low sodium heart healthy   Complete by: As directed      Allergies as of 09/09/2018   No Known Allergies     Medication List    TAKE these medications   HYDROcodone-acetaminophen 5-325 MG tablet Commonly known as: NORCO/VICODIN Take 1-2 tablets by mouth every 4 (four) hours as needed for moderate pain.   hydrocortisone 2.5 % rectal cream Commonly known as: Anusol-HC Place 1 application rectally 2 (two) times daily.      Follow-up Information    Herbert Pun, MD Follow up in 2 week(s).   Specialty: General Surgery Contact information: [address] [place] [address] [phone]

## 2018-09-09 NOTE — Progress Notes (Signed)
Demetries A Strub  A and O x 4. VSS. Pt tolerating diet well. No complaints of pain or nausea. IV removed intact, prescriptions given. Pt voiced understanding of discharge instructions with no further questions. Pt discharged home.   Allergies as of 09/09/2018   No Known Allergies     Medication List    TAKE these medications   HYDROcodone-acetaminophen 5-325 MG tablet Commonly known as: NORCO/VICODIN Take 1-2 tablets by mouth every 4 (four) hours as needed for moderate pain.   hydrocortisone 2.5 % rectal cream Commonly known as: Anusol-HC Place 1 application rectally 2 (two) times daily.       Vitals:   09/08/18 2356 09/09/18 0542  BP: 131/76 120/64  Pulse: (!) 53 (!) 49  Resp: 18 18  Temp: 98.6 F (37 C) 98.4 F (36.9 C)  SpO2: 97% 98%    Francesco Sor

## 2018-09-09 NOTE — Progress Notes (Signed)
Per MD okay for RN to DC IVF.  

## 2018-09-11 LAB — SURGICAL PATHOLOGY
